# Patient Record
Sex: Male | Born: 1990 | Race: White | Hispanic: No | Smoking: Current every day smoker
Health system: Southern US, Community
[De-identification: ages and names within clinical notes are randomized; demographics above are authoritative.]

## PROBLEM LIST (undated history)

## (undated) DIAGNOSIS — M1990 Unspecified osteoarthritis, unspecified site: Secondary | ICD-10-CM

## (undated) DIAGNOSIS — F419 Anxiety disorder, unspecified: Secondary | ICD-10-CM

## (undated) DIAGNOSIS — T7840XA Allergy, unspecified, initial encounter: Secondary | ICD-10-CM

## (undated) HISTORY — DX: Allergy, unspecified, initial encounter: T78.40XA

## (undated) HISTORY — DX: Anxiety disorder, unspecified: F41.9

## (undated) HISTORY — DX: Unspecified osteoarthritis, unspecified site: M19.90

---
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
## 2010-04-23 ENCOUNTER — Inpatient Hospital Stay (HOSPITAL_COMMUNITY): Admission: AC | Admit: 2010-04-23 | Discharge: 2010-04-27 | Payer: Self-pay | Source: Home / Self Care

## 2010-08-04 LAB — POCT I-STAT, CHEM 8
BUN: 12 mg/dL (ref 6–23)
Calcium, Ion: 1 mmol/L — ABNORMAL LOW (ref 1.12–1.32)
Creatinine, Ser: 1.3 mg/dL (ref 0.4–1.5)
Hemoglobin: 15.6 g/dL (ref 13.0–17.0)
Sodium: 138 meq/L (ref 135–145)
TCO2: 21 mmol/L (ref 0–100)

## 2010-08-04 LAB — COMPREHENSIVE METABOLIC PANEL
ALT: 64 U/L — ABNORMAL HIGH (ref 0–53)
AST: 143 U/L — ABNORMAL HIGH (ref 0–37)
Albumin: 3.7 g/dL (ref 3.5–5.2)
Alkaline Phosphatase: 48 U/L (ref 39–117)
Alkaline Phosphatase: 62 U/L (ref 39–117)
BUN: 10 mg/dL (ref 6–23)
CO2: 22 meq/L (ref 19–32)
CO2: 23 mEq/L (ref 19–32)
Calcium: 9.1 mg/dL (ref 8.4–10.5)
Chloride: 105 mEq/L (ref 96–112)
Creatinine, Ser: 1.11 mg/dL (ref 0.4–1.5)
GFR calc Af Amer: >60 mL/min (ref >60)
GFR calc non Af Amer: >60 mL/min (ref >60)
Glucose, Bld: 126 mg/dL — ABNORMAL HIGH (ref 70–99)
Glucose, Bld: 95 mg/dL (ref 70–99)
Potassium: 3.4 meq/L — ABNORMAL LOW (ref 3.5–5.1)
Potassium: 4.2 mEq/L (ref 3.5–5.1)
Sodium: 138 meq/L (ref 135–145)
Total Bilirubin: 1.8 mg/dL — ABNORMAL HIGH (ref 0.3–1.2)
Total Protein: 7.3 g/dL (ref 6.0–8.3)

## 2010-08-04 LAB — CBC
HCT: 38.6 % — ABNORMAL LOW (ref 39.0–52.0)
HCT: 43.2 % (ref 39.0–52.0)
Hemoglobin: 13.5 g/dL (ref 13.0–17.0)
Hemoglobin: 15.3 g/dL (ref 13.0–17.0)
MCH: 32.1 pg (ref 26.0–34.0)
MCHC: 35.4 g/dL (ref 30.0–36.0)
MCV: 91.3 fL (ref 78.0–100.0)
MCV: 91.9 fL (ref 78.0–100.0)
Platelets: 154 10*3/uL (ref 150–400)
RBC: 4.2 MIL/uL — ABNORMAL LOW (ref 4.22–5.81)
RDW: 12.3 % (ref 11.5–15.5)
WBC: 13.2 10*3/uL — ABNORMAL HIGH (ref 4.0–10.5)
WBC: 16.1 10*3/uL — ABNORMAL HIGH (ref 4.0–10.5)
WBC: 23.6 10*3/uL — ABNORMAL HIGH (ref 4.0–10.5)

## 2010-08-04 LAB — LIPASE, BLOOD: Lipase: 36 U/L (ref 11–59)

## 2010-08-04 LAB — LACTIC ACID, PLASMA: Lactic Acid, Venous: 2.8 mmol/L — ABNORMAL HIGH (ref 0.5–2.2)
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
## 2011-07-16 IMAGING — CR DG CHEST 2V
2 series · 2 of 2 positions shown · non-contrast
Comparison: 04/24/2010

CLINICAL DATA: Left pneumothorax.

CHEST - 2 VIEW

[w chest pa]
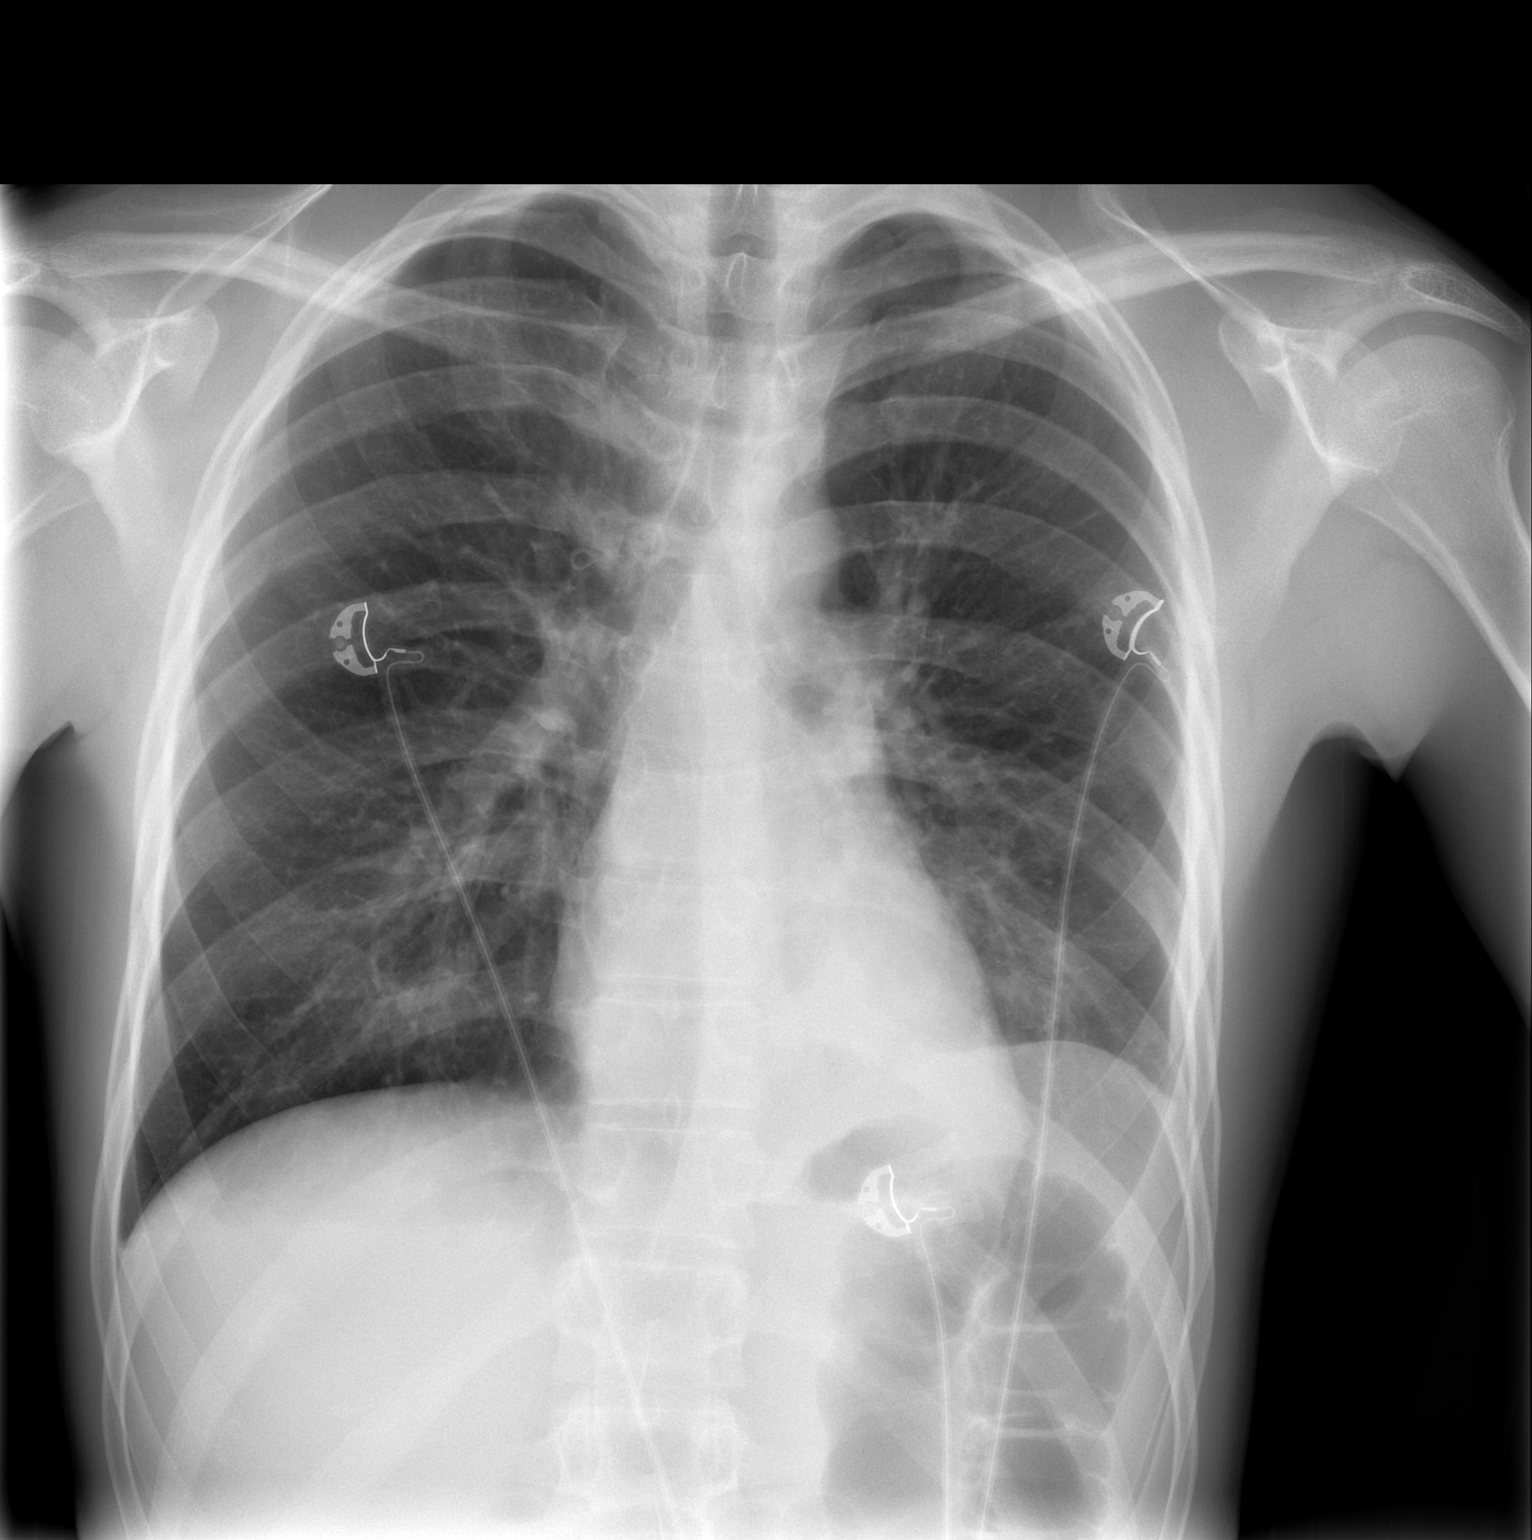

[w chest lat]
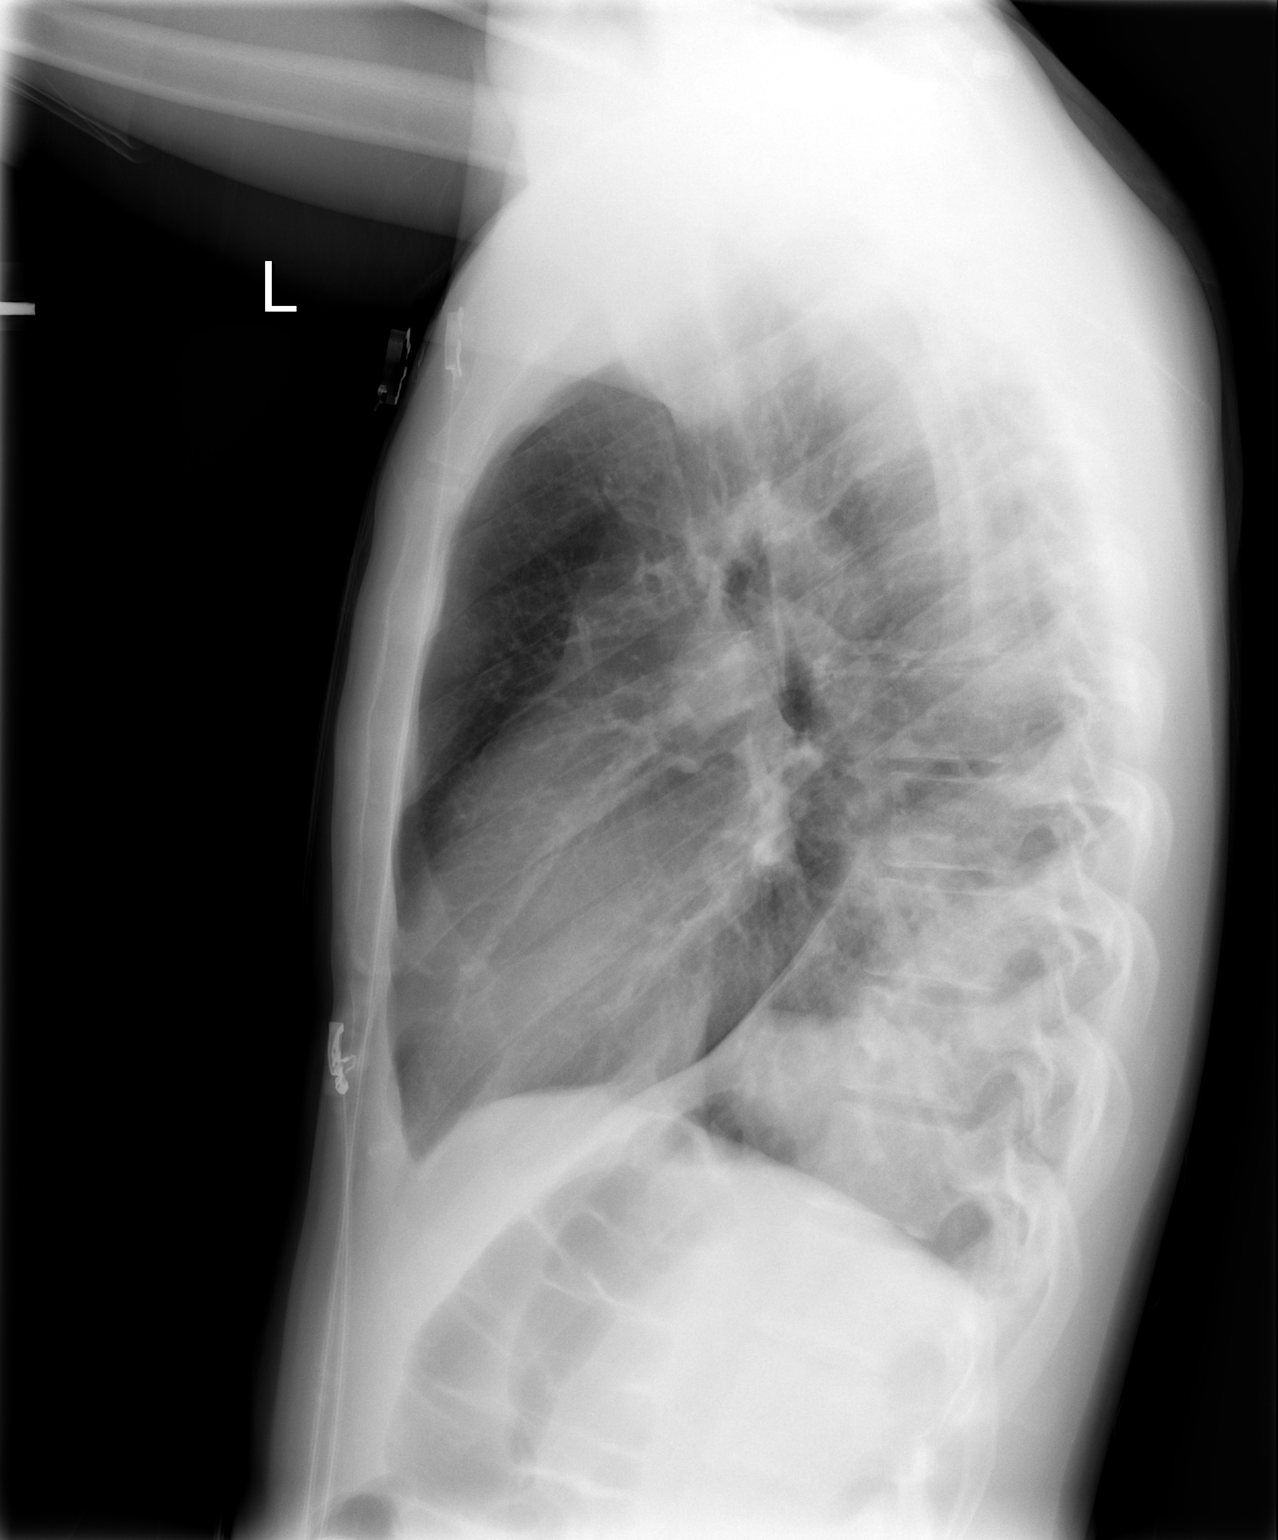

[2 of 2 positions shown; findings below may reference images not displayed]

FINDINGS: The tiny left pneumothorax has almost completely
resolved.  Left lateral rib fractures are again noted.

The patient has developed an area of atelectasis and infiltrate at
the left lung base posterior medially.  This could  represent
pulmonary contusion.

The right lung is clear.
IMPRESSION: 1.  The tiny left apical pneumothorax has almost resolved.
2.  New consolidation and atelectasis in the left lung base
posterior medially which may represent atelectasis and/or lung
contusion.

## 2021-11-17 ENCOUNTER — Ambulatory Visit: Payer: Self-pay | Admitting: Physician Assistant

## 2021-11-17 NOTE — Progress Notes (Deleted)
    New patient visit   Patient: Warren Day   DOB: 1990/07/01   30 y.o. Male  MRN: [id number] Visit Date: 11/17/2021  Today's healthcare provider: Alfredia Ferguson, PA-C   No chief complaint on file.  Subjective    Warren Day is a 31 y.o. male who presents today as a new patient to establish care.  HPI  ***  No past medical history on file. *** The histories are not reviewed yet. Please review them in the "History" navigator section and refresh this SmartLink. No family status information on file.   No family history on file. Social History   Socioeconomic History   Marital status: Single    Spouse name: Not on file   Number of children: Not on file   Years of education: Not on file   Highest education level: Not on file  Occupational History   Not on file  Tobacco Use   Smoking status: Not on file   Smokeless tobacco: Not on file  Substance and Sexual Activity   Alcohol use: Not on file   Drug use: Not on file   Sexual activity: Not on file  Other Topics Concern   Not on file  Social History Narrative   Not on file   Social Determinants of Health   Financial Resource Strain: Not on file  Food Insecurity: Not on file  Transportation Needs: Not on file  Physical Activity: Not on file  Stress: Not on file  Social Connections: Not on file   No outpatient medications prior to visit.   No facility-administered medications prior to visit.   Not on File   There is no immunization history on file for this patient.  Health Maintenance  Topic Date Due   HIV Screening  Never done   Hepatitis C Screening  Never done   TETANUS/TDAP  Never done   INFLUENZA VACCINE  12/22/2021   HPV VACCINES  Aged Out    No care team member to display  Review of Systems  {Labs  Heme  Chem  Endocrine  Serology  Results Review (optional):23779}   Objective    There were no vitals taken for this visit. {Show previous vital signs  (optional):23777}  Physical Exam ***  Depression Screen     No data to display         No results found for any visits on 11/17/21.  Assessment & Plan     ***  No follow-ups on file.     {provider attestation***:1}   Alfredia Ferguson, PA-C  [facility] [phone] (phone) [phone] (fax)  [facility]

## 2022-02-17 ENCOUNTER — Encounter: Payer: Self-pay | Admitting: Physician Assistant

## 2022-02-17 ENCOUNTER — Ambulatory Visit (INDEPENDENT_AMBULATORY_CARE_PROVIDER_SITE_OTHER): Payer: Managed Care, Other (non HMO) | Admitting: Physician Assistant

## 2022-02-17 VITALS — BP 120/82 | HR 63 | Ht 71.0 in | Wt 168.5 lb

## 2022-02-17 DIAGNOSIS — Z1159 Encounter for screening for other viral diseases: Secondary | ICD-10-CM | POA: Diagnosis not present

## 2022-02-17 DIAGNOSIS — R5383 Other fatigue: Secondary | ICD-10-CM | POA: Insufficient documentation

## 2022-02-17 DIAGNOSIS — Z0000 Encounter for general adult medical examination without abnormal findings: Secondary | ICD-10-CM

## 2022-02-17 DIAGNOSIS — Z114 Encounter for screening for human immunodeficiency virus [HIV]: Secondary | ICD-10-CM | POA: Diagnosis not present

## 2022-02-17 NOTE — Progress Notes (Signed)
I,Sha'taria Tyson,acting as a Neurosurgeon for [employer], PA-C.,have documented all relevant documentation on the behalf of Alfredia Ferguson, PA-C,as directed by  Alfredia Ferguson, PA-C while in the presence of Alfredia Ferguson, PA-C.  New patient visit   Patient: Warren Day   DOB: March 08, 1991   30 y.o. Male  MRN: [id number] Visit Date: 02/17/2022  Today's healthcare provider: Alfredia Ferguson, PA-C   Cc. New pt establish care  Subjective    Warren Day is a 31 y.o. male who presents today as a new patient to establish care.  HPI  Patient Is wanting to establish care and would like to just do preventative care today. When asked concerns today-- pt denies, only mentions he sometimes deals with generalized fatigue and is curious about his testosterone levels. Tobacco use , 7.5 year pack history, attempting to quit currently switching to vaping.  Past Medical History:  Diagnosis Date   Allergy    Anxiety    Arthritis    History reviewed. No pertinent surgical history. Family Status  Relation Name Status   Mother  (Not Specified)   Father  (Not Specified)   Sister  (Not Specified)   Family History  Problem Relation Age of Onset   Allergies Mother    Anxiety disorder Mother    Arthritis Mother    Allergies Father    Allergies Sister    Social History   Socioeconomic History   Marital status: Significant Other    Spouse name: Not on file   Number of children: Not on file   Years of education: Not on file   Highest education level: Not on file  Occupational History   Not on file  Tobacco Use   Smoking status: Every Day    Packs/day: 0.50    Years: 15.00    Total pack years: 7.50    Types: Cigarettes   Smokeless tobacco: Never  Vaping Use   Vaping Use: Every day   Start date: 01/31/2022  Substance and Sexual Activity   Alcohol use: Yes    Comment: 2-3 drinks unspecified a week   Drug use: Yes    Types: Marijuana   Sexual activity: Yes  Other Topics  Concern   Not on file  Social History Narrative   Not on file   Social Determinants of Health   Financial Resource Strain: Not on file  Food Insecurity: Not on file  Transportation Needs: Not on file  Physical Activity: Not on file  Stress: Not on file  Social Connections: Not on file   No outpatient medications prior to visit.   No facility-administered medications prior to visit.   Allergies  Allergen Reactions   Poison Ivy Extract Rash     There is no immunization history on file for this patient.  Health Maintenance  Topic Date Due   COVID-19 Vaccine (1) Never done   Hepatitis C Screening  Never done   TETANUS/TDAP  Never done   INFLUENZA VACCINE  08/22/2022 (Originally 12/22/2021)   HIV Screening  Completed   HPV VACCINES  Aged Out    Patient Care Team: Alfredia Ferguson, PA-C as PCP - General (Physician Assistant)  Review of Systems  Constitutional:  Positive for appetite change and fatigue.  HENT:  Positive for congestion, drooling, rhinorrhea, sore throat and tinnitus.   Eyes:  Positive for pain.  Respiratory:  Positive for chest tightness and shortness of breath.   Genitourinary:  Positive for frequency.  Musculoskeletal:  Positive for back  pain.     Objective    Blood pressure 120/82, pulse 63, height 5\' 11"  (1.803 m), weight 168 lb 8 oz (76.4 kg), SpO2 99 %.   Physical Exam Constitutional:      General: He is awake.     Appearance: He is well-developed.  HENT:     Head: Normocephalic.     Right Ear: Tympanic membrane, ear canal and external ear normal.     Left Ear: Tympanic membrane, ear canal and external ear normal.     Nose: Nose normal. No congestion or rhinorrhea.     Mouth/Throat:     Mouth: Mucous membranes are moist.     Pharynx: No oropharyngeal exudate or posterior oropharyngeal erythema.  Eyes:     Pupils: Pupils are equal, round, and reactive to light.  Cardiovascular:     Rate and Rhythm: Normal rate and regular rhythm.      Heart sounds: Normal heart sounds.  Pulmonary:     Effort: Pulmonary effort is normal.     Breath sounds: Normal breath sounds.  Abdominal:     General: There is no distension.     Palpations: Abdomen is soft.     Tenderness: There is no abdominal tenderness. There is no guarding.  Musculoskeletal:     Cervical back: Normal range of motion.     Right lower leg: No edema.     Left lower leg: No edema.  Lymphadenopathy:     Cervical: No cervical adenopathy.  Skin:    General: Skin is warm.  Neurological:     Mental Status: He is alert and oriented to person, place, and time.  Psychiatric:        Attention and Perception: Attention normal.        Mood and Affect: Mood normal.        Speech: Speech normal.        Behavior: Behavior normal. Behavior is cooperative.    Depression Screen    02/17/2022   10:50 AM  PHQ 2/9 Scores  PHQ - 2 Score 2  PHQ- 9 Score 8   No results found for any visits on 02/17/22.  Assessment & Plan      Problem List Items Addressed This Visit       Other   Other fatigue    Likely unrelated to any particular medication condition But will check testosterone levels per pt and tsh/t4      Relevant Orders   Testosterone,Free and Total   TSH + free T4   Other Visit Diagnoses     Annual physical exam    -  Primary   Relevant Orders   CBC w/Diff/Platelet   Comprehensive Metabolic Panel (CMET)   Lipid Profile   HgB A1c   Screening for HIV (human immunodeficiency virus)       Relevant Orders   HIV antibody (with reflex)   Encounter for hepatitis C screening test for low risk patient       Relevant Orders   Hepatitis C antibody      Declines flu vaccines Return in about 1 year (around 02/18/2023) for CPE.     I, Mikey Kirschner, PA-C have reviewed all documentation for this visit. The documentation on  02/17/2022 for the exam, diagnosis, procedures, and orders are all accurate and complete.  Mikey Kirschner, PA-C [facility] [address] [place], [place], [address] Office: [phone] Fax: [place]

## 2022-02-17 NOTE — Patient Instructions (Signed)
Please be sure to have labs done while fasting. Our lab is open Monday - Friday from 8:00 am - 4:30 pm. The lab will close at 11:30 am for lunch and reopen at 1:00 pm. No appointment is needed to receive labs just be sure to have your lab slip available to provide to front desk upon arrival.

## 2022-02-17 NOTE — Assessment & Plan Note (Signed)
Likely unrelated to any particular medication condition But will check testosterone levels per pt and tsh/t4

## 2022-02-18 ENCOUNTER — Other Ambulatory Visit: Payer: Self-pay | Admitting: Physician Assistant

## 2022-02-18 DIAGNOSIS — Z3141 Encounter for fertility testing: Secondary | ICD-10-CM

## 2022-02-19 LAB — CBC WITH DIFFERENTIAL/PLATELET
Eos: 1 %
Immature Granulocytes: 0 %
Lymphs: 12 %
MCHC: 35.3 g/dL (ref 31.5–35.7)
MCV: 89 fL (ref 79–97)
Monocytes Absolute: 1 10*3/uL — ABNORMAL HIGH (ref 0.1–0.9)
Platelets: 245 10*3/uL (ref 150–450)

## 2022-02-19 LAB — COMPREHENSIVE METABOLIC PANEL
Chloride: 101 mmol/L (ref 96–106)
Creatinine, Ser: 1.26 mg/dL (ref 0.76–1.27)
Potassium: 4.5 mmol/L (ref 3.5–5.2)
Sodium: 138 mmol/L (ref 134–144)
Total Protein: 7.3 g/dL (ref 6.0–8.5)

## 2022-02-19 LAB — HEPATITIS C ANTIBODY: Hep C Virus Ab: NONREACTIVE

## 2022-02-19 LAB — LIPID PANEL
Chol/HDL Ratio: 3.3 ratio (ref 0.0–5.0)
Cholesterol, Total: 156 mg/dL (ref 100–199)
LDL Chol Calc (NIH): 92 mg/dL (ref 0–99)

## 2022-02-19 LAB — HEMOGLOBIN A1C: Hgb A1c MFr Bld: 5.3 % (ref 4.8–5.6)

## 2022-02-22 ENCOUNTER — Other Ambulatory Visit: Payer: Self-pay | Admitting: Physician Assistant

## 2022-02-22 DIAGNOSIS — D72829 Elevated white blood cell count, unspecified: Secondary | ICD-10-CM

## 2022-02-25 LAB — COMPREHENSIVE METABOLIC PANEL
ALT: 17 IU/L (ref 0–44)
AST: 23 IU/L (ref 0–40)
Albumin/Globulin Ratio: 2.2 (ref 1.2–2.2)
Albumin: 5 g/dL (ref 4.1–5.1)
Alkaline Phosphatase: 77 IU/L (ref 44–121)
BUN/Creatinine Ratio: 10 (ref 9–20)
BUN: 12 mg/dL (ref 6–20)
Bilirubin Total: 1.2 mg/dL (ref 0.0–1.2)
CO2: 24 mmol/L (ref 20–29)
Calcium: 9.9 mg/dL (ref 8.7–10.2)
Globulin, Total: 2.3 g/dL (ref 1.5–4.5)
Glucose: 93 mg/dL (ref 70–99)
eGFR: 78 mL/min/{1.73_m2} (ref >59)

## 2022-02-25 LAB — CBC WITH DIFFERENTIAL/PLATELET
Basophils Absolute: 0 10*3/uL (ref 0.0–0.2)
Basos: 0 %
EOS (ABSOLUTE): 0.1 10*3/uL (ref 0.0–0.4)
Hematocrit: 42.8 % (ref 37.5–51.0)
Hemoglobin: 15.1 g/dL (ref 13.0–17.7)
Immature Grans (Abs): 0 10*3/uL (ref 0.0–0.1)
Lymphocytes Absolute: 1.4 10*3/uL (ref 0.7–3.1)
MCH: 31.3 pg (ref 26.6–33.0)
Monocytes: 9 %
Neutrophils Absolute: 9.6 10*3/uL — ABNORMAL HIGH (ref 1.4–7.0)
Neutrophils: 78 %
RBC: 4.82 x10E6/uL (ref 4.14–5.80)
RDW: 11.8 % (ref 11.6–15.4)
WBC: 12.2 10*3/uL — ABNORMAL HIGH (ref 3.4–10.8)

## 2022-02-25 LAB — TESTOSTERONE,FREE AND TOTAL
Testosterone, Free: 10.9 pg/mL (ref 8.7–25.1)
Testosterone: 580 ng/dL (ref 264–916)

## 2022-02-25 LAB — HIV ANTIBODY (ROUTINE TESTING W REFLEX): HIV Screen 4th Generation wRfx: NONREACTIVE

## 2022-02-25 LAB — LIPID PANEL
HDL: 48 mg/dL (ref >39)
Triglycerides: 83 mg/dL (ref 0–149)
VLDL Cholesterol Cal: 16 mg/dL (ref 5–40)

## 2022-02-25 LAB — TSH+FREE T4
Free T4: 1.62 ng/dL (ref 0.82–1.77)
TSH: 0.678 u[IU]/mL (ref 0.450–4.500)

## 2022-02-25 LAB — HEMOGLOBIN A1C: Est. average glucose Bld gHb Est-mCnc: 105 mg/dL

## 2022-03-16 LAB — PATHOLOGIST SMEAR REVIEW
Basophils Absolute: 0 10*3/uL (ref 0.0–0.2)
EOS (ABSOLUTE): 0.1 10*3/uL (ref 0.0–0.4)
Hematocrit: 42.2 % (ref 37.5–51.0)
Immature Grans (Abs): 0 10*3/uL (ref 0.0–0.1)
Lymphs: 28 %
MCHC: 34.8 g/dL (ref 31.5–35.7)
Neutrophils Absolute: 4.3 10*3/uL (ref 1.4–7.0)
Neutrophils: 61 %
Platelets: 248 10*3/uL (ref 150–450)
RDW: 12.6 % (ref 11.6–15.4)

## 2022-03-18 LAB — PATHOLOGIST SMEAR REVIEW
Basos: 0 %
Eos: 1 %
Hemoglobin: 14.7 g/dL (ref 13.0–17.7)
Immature Granulocytes: 0 %
Lymphocytes Absolute: 2 10*3/uL (ref 0.7–3.1)
MCH: 31.5 pg (ref 26.6–33.0)
MCV: 90 fL (ref 79–97)
Monocytes Absolute: 0.7 10*3/uL (ref 0.1–0.9)
Monocytes: 10 %
RBC: 4.67 x10E6/uL (ref 4.14–5.80)
WBC: 7.1 10*3/uL (ref 3.4–10.8)

## 2022-07-05 ENCOUNTER — Other Ambulatory Visit: Payer: Self-pay

## 2022-07-05 ENCOUNTER — Telehealth: Payer: Managed Care, Other (non HMO) | Admitting: Physician Assistant

## 2022-07-05 DIAGNOSIS — A084 Viral intestinal infection, unspecified: Secondary | ICD-10-CM

## 2022-07-05 MED ORDER — ONDANSETRON 4 MG PO TBDP
4.0000 mg | ORAL_TABLET | Freq: Three times a day (TID) | ORAL | 0 refills | Status: DC | PRN
  Filled 2022-07-05: qty 20, 7d supply, fill #0

## 2022-07-05 NOTE — Progress Notes (Signed)

## 2022-08-23 ENCOUNTER — Telehealth: Payer: Managed Care, Other (non HMO) | Admitting: Family Medicine

## 2022-08-23 ENCOUNTER — Encounter: Payer: Self-pay | Admitting: Physician Assistant

## 2022-08-23 ENCOUNTER — Other Ambulatory Visit: Payer: Self-pay

## 2022-08-23 DIAGNOSIS — L237 Allergic contact dermatitis due to plants, except food: Secondary | ICD-10-CM

## 2022-08-23 MED ORDER — TRIAMCINOLONE ACETONIDE 0.025 % EX OINT
1.0000 | TOPICAL_OINTMENT | Freq: Two times a day (BID) | CUTANEOUS | 0 refills | Status: DC
Start: 2022-08-23 — End: 2023-03-16
  Filled 2022-08-23: qty 30, 15d supply, fill #0

## 2022-08-23 NOTE — Progress Notes (Signed)
E-Visit for [employer]  We are sorry that you are not feeing well.  Here is how we plan to help!  Based on what you have shared with me it looks like you have had an allergic reaction to the oily resin from a group of plants.  This resin is very sticky, so it easily attaches to your skin, clothing, tools equipment, and pet's fur.    This blistering rash is often called poison ivy rash although it can come from contact with the leaves, stems and roots of poison ivy, poison oak and poison sumac.  The oily resin contains urushiol (u-ROO-she-ol) that produces a skin rash on exposed skin.  The severity of the rash depends on the amount of urushiol that gets on your skin.  A section of skin with more urushiol on it may develop a rash sooner.  The rash usually develops 12-48 hours after exposure and can last two to three weeks.  Your skin must come in direct contact with the plant's oil to be affected.  Blister fluid doesn't spread the rash.  However, if you come into contact with a piece of clothing or pet fur that has urushiol on it, the rash may spread out.  You can also transfer the oil to other parts of your body with your fingers.  Often the rash looks like a straight line because of the way the plant brushes against your skin.  Most poison ivy treatments are usually limited to self-care methods. Small areas of the rash will typically go away on its own in two to three weeks.  Since your rash is limited, I am recommending that you follow these recommendations:  Make sure that the clothes you were wearing and any towels or sheets that may have come in contact with the oil (urushiol) are washed in detergent and hot water.  Apply Benadryl or Caladryl lotion to the rash.  You may apply these as often as needed to control the itching.  Cool baths also often help with itching.  You may also apply an over-the-counter corticosteroid cream for the first few days.  Take oral antihistamines, such as  diphenhydramine (Benadryl, others), which may also help you sleep better.  Soak in a cool-water bath containing an oatmeal-based bath product (Aveeno).  Place Cool, wet compresses on the affected area for 15 to 30 minutes several times a day.  Avoid a hot shower or bath as this may increase your itching.     I have developed the following plan to treat your condition  It appears very mild, we usually limit the use of prednisone unless it has spread to several areas of the body.   I will order a topical steroid ointment to prevent this from worsening.  What can you do to prevent this rash?  Avoid the plants.  Learn how to identify poison ivy, poison oak and poison sumac in all seasons.  When hiking or engaging in other activities that might expose you to these plants, try to stay on cleared pathways.  If camping, make sure you pitch your tent in an area free of these plants.  Keep pets from running through wooded areas so that urushiol doesn't accidentally stick to their fur, which you may touch.  Remove or kill the plants.  In your yard, you can get rid of poison ivy by applying an herbicide or pulling it out of the ground, including the roots, while wearing heavy gloves.  Afterward remove the gloves and  thoroughly wash them and your hands.  Don't burn poison ivy or related plants because the urushiol can be carried by smoke.  Wear protective clothing.  If needed, protect your skin by wearing socks, boots, pants, long sleeves and vinyl gloves.  Wash your skin right away.  Washing off the oil with soap and water within 30 minutes of exposure may reduce your chances of getting a poison ivy rash.  Even washing after an hour or so can help reduce the severity of the rash.  If you walk through some poison ivy and then later touch your shoes, you may get some urushiol on your hands, which may then transfer to your face or body by touching or rubbing.  If the contaminated object isn't cleaned, the urushiol  on it can still cause a skin reaction years later.    Be careful not to reuse towels after you have washed your skin.  Also carefully wash clothing in detergent and hot water to remove all traces of the oil.  Handle contaminated clothing carefully so you don't transfer the urushiol to yourself, furniture, rugs or appliances.  Remember that pets can carry the oil on their fur and paws.  If you think your pet may be contaminated with urushiol, put on some long rubber gloves and give your pet a bath.  Finally, be careful not to burn these plants as the smoke can contain traces of the oil.  Inhaling the smoke may result in difficulty breathing. If that occurred you should see a physician as soon as possible.  See your doctor right away if:  The reaction is severe or widespread You inhaled the smoke from burning poison ivy and are having difficulty breathing Your skin continues to swell The rash affects your eyes, mouth or genitals Blisters are oozing pus You develop a fever greater than 100 F (37.8 C) The rash doesn't get better within a few weeks.  If you scratch the poison ivy rash, bacteria under your fingernails may cause the skin to become infected.  See your doctor if pus starts oozing from the blisters.  Treatment generally includes antibiotics.  Poison ivy treatments are usually limited to self-care methods.  And the rash typically goes away on its own in two to three weeks.     If the rash is widespread or results in a large number of blisters, your doctor may prescribe an oral corticosteroid, such as prednisone.  If a bacterial infection has developed at the rash site, your doctor may give you a prescription for an oral antibiotic.  MAKE SURE YOU  Understand these instructions. Will watch your condition. Will get help right away if you are not doing well or get worse.   Thank you for choosing an e-visit.  Your e-visit answers were reviewed by a board certified advanced  clinical practitioner to complete your personal care plan. Depending upon the condition, your plan could have included both over the counter or prescription medications.  Please review your pharmacy choice. Make sure the pharmacy is open so you can pick up prescription now. If there is a problem, you may contact your provider through [employer] and have the prescription routed to another pharmacy.  Your safety is important to [place]. If you have drug allergies check your prescription carefully.   For the next 24 hours you can use MyChart to ask questions about today's visit, request a non-urgent call back, or ask for a work or school excuse. You will get an email  in the next two days asking about your experience. I hope that your e-visit has been valuable and will speed your recovery.   I provided 5 minutes of non face-to-face time during this encounter for chart review, medication and order placement, as well as and documentation.

## 2022-08-24 ENCOUNTER — Encounter: Payer: Self-pay | Admitting: Physician Assistant

## 2022-08-30 ENCOUNTER — Other Ambulatory Visit: Payer: Self-pay

## 2022-08-30 ENCOUNTER — Ambulatory Visit: Payer: Managed Care, Other (non HMO) | Admitting: Physician Assistant

## 2022-08-30 VITALS — BP 111/74 | HR 66 | Ht 71.0 in | Wt 168.0 lb

## 2022-08-30 DIAGNOSIS — S30861A Insect bite (nonvenomous) of abdominal wall, initial encounter: Secondary | ICD-10-CM

## 2022-08-30 DIAGNOSIS — W57XXXA Bitten or stung by nonvenomous insect and other nonvenomous arthropods, initial encounter: Secondary | ICD-10-CM

## 2022-08-30 MED ORDER — MUPIROCIN 2 % EX OINT
1.0000 | TOPICAL_OINTMENT | Freq: Two times a day (BID) | CUTANEOUS | 0 refills | Status: DC
Start: 2022-08-30 — End: 2023-03-16
  Filled 2022-08-30: qty 22, 15d supply, fill #0

## 2022-08-30 MED ORDER — DOXYCYCLINE HYCLATE 100 MG PO TABS
100.0000 mg | ORAL_TABLET | Freq: Two times a day (BID) | ORAL | 0 refills | Status: AC
Start: 2022-08-30 — End: 2022-09-09
  Filled 2022-08-30: qty 20, 10d supply, fill #0

## 2022-08-30 NOTE — Progress Notes (Signed)
     I,Sha'taria Tyson,acting as a Neurosurgeon for [employer], PA-C.,have documented all relevant documentation on the behalf of Alfredia Ferguson, PA-C,as directed by  Alfredia Ferguson, PA-C while in the presence of Alfredia Ferguson, PA-C.   Established patient visit   Patient: Warren Day   DOB: 09/07/1990   32 y.o. Male  MRN: [id number] Visit Date: 08/30/2022  Today's healthcare provider: Alfredia Ferguson, PA-C   Cc. Tick bite  Subjective    HPI  Pt reports being bit by a tick 08/21/2022.  He was outside over the weekend and noticed it the following Monday. He was able to remove it but noticed some oozing at the site.   Patient reports he has only wiped it with alcohol to clean it. Patient reports it does look like its starting to scab   Medications: Outpatient Medications Prior to Visit  Medication Sig   ondansetron (ZOFRAN-ODT) 4 MG disintegrating tablet Dissolve 1 tablet (4 mg total) in the mouth every 8 (eight) hours as needed. (Patient not taking: Reported on 08/30/2022)   triamcinolone (KENALOG) 0.025 % ointment Apply 1 Application topically in the morning and at bedtime. (Patient not taking: Reported on 08/30/2022)   No facility-administered medications prior to visit.    Review of Systems  Constitutional:  Negative for fatigue and fever.  Respiratory:  Negative for cough and shortness of breath.   Cardiovascular:  Negative for chest pain, palpitations and leg swelling.  Neurological:  Negative for dizziness and headaches.      Objective    BP 111/74 (BP Location: Left Arm, Patient Position: Sitting, Cuff Size: Normal)   Pulse 66   Ht 5\' 11"  (1.803 m)   Wt 168 lb (76.2 kg)   SpO2 99%   BMI 23.43 kg/m    Physical Exam Vitals reviewed.  Constitutional:      Appearance: He is not ill-appearing.  HENT:     Head: Normocephalic.  Eyes:     Conjunctiva/sclera: Conjunctivae normal.  Cardiovascular:     Rate and Rhythm: Normal rate.  Pulmonary:     Effort: Pulmonary  effort is normal. No respiratory distress.  Skin:    Comments: Left side of umbilicus with a erythematous papule. Not fluctuant  Neurological:     General: No focal deficit present.     Mental Status: He is alert and oriented to person, place, and time.  Psychiatric:        Mood and Affect: Mood normal.        Behavior: Behavior normal.      No results found for any visits on 08/30/22.  Assessment & Plan     Tick bite, abdomen Does not appear infected No erythematous migrans Given timeline will treat Prophylactically for tick borne disease Doxy 100 bid x 10 days Given topical muiporcin   Return if symptoms worsen or fail to improve.      I, Alfredia Ferguson, PA-C have reviewed all documentation for this visit. The documentation on  08/30/22 for the exam, diagnosis, procedures, and orders are all accurate and complete.  Alfredia Ferguson, PA-C [facility] [address] [place], [place], [address] Office: [phone] Fax: [phone]   [facility]

## 2022-12-01 ENCOUNTER — Encounter (INDEPENDENT_AMBULATORY_CARE_PROVIDER_SITE_OTHER): Payer: Managed Care, Other (non HMO) | Admitting: Physician Assistant

## 2022-12-01 DIAGNOSIS — K1379 Other lesions of oral mucosa: Secondary | ICD-10-CM

## 2022-12-01 NOTE — Telephone Encounter (Signed)

## 2023-02-22 ENCOUNTER — Encounter: Payer: Self-pay | Admitting: Physician Assistant

## 2023-02-28 ENCOUNTER — Encounter: Payer: Managed Care, Other (non HMO) | Admitting: Family Medicine

## 2023-03-16 ENCOUNTER — Encounter: Payer: Managed Care, Other (non HMO) | Admitting: Family Medicine

## 2023-03-16 DIAGNOSIS — Z0000 Encounter for general adult medical examination without abnormal findings: Secondary | ICD-10-CM | POA: Insufficient documentation

## 2023-03-16 NOTE — Progress Notes (Deleted)
Complete physical exam   Patient: Warren Day   DOB: [birth year]   32 y.o. Male  MRN: [id number] Visit Date: 03/16/2023  Today's healthcare provider: Sherlyn Hay, DO   No chief complaint on file.  Subjective    Warren Day is a 32 y.o. male who presents today for a complete physical exam.  He reports consuming a {diet types:17450} diet. {Exercise:19826} He generally feels {well/fairly well/poorly:18703}. He reports sleeping {well/fairly well/poorly:18703}. He {does/does not:200015} have additional problems to discuss today.  HPI      ***  Past Medical History:  Diagnosis Date   Allergy    Anxiety    Arthritis    No past surgical history on file. Social History   Socioeconomic History   Marital status: Significant Other    Spouse name: Not on file   Number of children: Not on file   Years of education: Not on file   Highest education level: 12th grade  Occupational History   Not on file  Tobacco Use   Smoking status: Every Day    Current packs/day: 0.50    Average packs/day: 0.5 packs/day for 15.0 years (7.5 ttl pk-yrs)    Types: Cigarettes   Smokeless tobacco: Never  Vaping Use   Vaping status: Every Day   Start date: 01/31/2022  Substance and Sexual Activity   Alcohol use: Yes    Comment: 2-3 drinks unspecified a week   Drug use: Yes    Types: Marijuana   Sexual activity: Yes  Other Topics Concern   Not on file  Social History Narrative   [employer]   Flies and repairs planes   Social Determinants of Health   Financial Resource Strain: Medium Risk (03/14/2023)   Overall Financial Resource Strain (CARDIA)    Difficulty of Paying Living Expenses: Somewhat hard  Food Insecurity: Food Insecurity Present (03/14/2023)   Hunger Vital Sign    Worried About Running Out of Food in the Last Year: Sometimes true    Ran Out of Food in the Last Year: Sometimes true  Transportation Needs: No Transportation Needs (03/14/2023)   PRAPARE -  Administrator, Civil Service (Medical): No    Lack of Transportation (Non-Medical): No  Physical Activity: Inactive (03/14/2023)   Exercise Vital Sign    Days of Exercise per Week: 0 days    Minutes of Exercise per Session: 30 min  Stress: Stress Concern Present (03/14/2023)   [employer] of Occupational Health - Occupational Stress Questionnaire    Feeling of Stress : Very much  Social Connections: Moderately Isolated (03/14/2023)   Social Connection and Isolation Panel [NHANES]    Frequency of Communication with Friends and Family: Once a week    Frequency of Social Gatherings with Friends and Family: Once a week    Attends Religious Services: More than 4 times per year    Active Member of [employer] or Organizations: Yes    Attends Engineer, structural: More than 4 times per year    Marital Status: Never married  Catering manager Violence: Not on file   Family Status  Relation Name Status   Mother  (Not Specified)   Father  (Not Specified)   Sister  (Not Specified)  No partnership data on file   Family History  Problem Relation Age of Onset   Allergies Mother    Anxiety disorder Mother    Arthritis Mother    Allergies Father    Allergies Sister  Allergies  Allergen Reactions   Poison Ivy Extract Rash   Penicillins Rash    At 7 or 8    Patient Care Team: Burnett Corrente as PCP - General (Physician Assistant)   Medications: Outpatient Medications Prior to Visit  Medication Sig   [DISCONTINUED] mupirocin ointment (BACTROBAN) 2 % Apply 1 Application topically 2 (two) times daily.   [DISCONTINUED] ondansetron (ZOFRAN-ODT) 4 MG disintegrating tablet Dissolve 1 tablet (4 mg total) in the mouth every 8 (eight) hours as needed. (Patient not taking: Reported on 08/30/2022)   [DISCONTINUED] triamcinolone (KENALOG) 0.025 % ointment Apply 1 Application topically in the morning and at bedtime. (Patient not taking: Reported on 08/30/2022)   No  facility-administered medications prior to visit.    Review of Systems {Insert previous labs (optional):23779} {See past labs  Heme  Chem  Endocrine  Serology  Results Review (optional):1}  Objective    There were no vitals taken for this visit. {Insert last BP/Wt (optional):23777}{See vitals history (optional):1}  Physical Exam  ***  Last depression screening scores    08/30/2022    8:28 AM 02/17/2022   10:50 AM  PHQ 2/9 Scores  PHQ - 2 Score 2 2  PHQ- 9 Score 6 8   Last fall risk screening    08/30/2022    8:28 AM  Fall Risk   Falls in the past year? 0  Number falls in past yr: 0  Injury with Fall? 0  Risk for fall due to : No Fall Risks  Follow up Falls evaluation completed   Last Audit-C alcohol use screening    03/14/2023    5:12 PM  Alcohol Use Disorder Test (AUDIT)  1. How often do you have a drink containing alcohol? 2  2. How many drinks containing alcohol do you have on a typical day when you are drinking? 1  3. How often do you have six or more drinks on one occasion? 1  AUDIT-C Score 4  4. How often during the last year have you found that you were not able to stop drinking once you had started? 0  5. How often during the last year have you failed to do what was normally expected from you because of drinking? 0  6. How often during the last year have you needed a first drink in the morning to get yourself going after a heavy drinking session? 0  7. How often during the last year have you had a feeling of guilt of remorse after drinking? 0  8. How often during the last year have you been unable to remember what happened the night before because you had been drinking? 0  9. Have you or someone else been injured as a result of your drinking? 0  10. Has a relative or friend or a doctor or another health worker been concerned about your drinking or suggested you cut down? 0  Alcohol Use Disorder Identification Test Final Score (AUDIT) 4   A score of 3 or  more in women, and 4 or more in men indicates increased risk for alcohol abuse, EXCEPT if all of the points are from question 1   No results found for any visits on 03/16/23.  Assessment & Plan    Routine Health Maintenance and Physical Exam  Exercise Activities and Dietary recommendations  Goals   None      There is no immunization history on file for this patient.  Health Maintenance  Topic Date Due   DTaP/Tdap/Td (  1 - Tdap) Never done   INFLUENZA VACCINE  Never done   COVID-19 Vaccine (1 - 2023-24 season) Never done   Hepatitis C Screening  Completed   HIV Screening  Completed   HPV VACCINES  Aged Out    Discussed health benefits of physical activity, and encouraged him to engage in regular exercise appropriate for his age and condition.   Annual physical exam     ***  No follow-ups on file.     I discussed the assessment and treatment plan with the patient  The patient was provided an opportunity to ask questions and all were answered. The patient agreed with the plan and demonstrated an understanding of the instructions.   The patient was advised to call back or seek an in-person evaluation if the symptoms worsen or if the condition fails to improve as anticipated.    Sherlyn Hay, DO  [facility] [phone] (phone) [phone] (fax)  [facility]

## 2023-03-18 ENCOUNTER — Other Ambulatory Visit: Payer: Self-pay

## 2023-03-18 ENCOUNTER — Encounter: Payer: Self-pay | Admitting: Family Medicine

## 2023-03-18 ENCOUNTER — Ambulatory Visit (INDEPENDENT_AMBULATORY_CARE_PROVIDER_SITE_OTHER): Payer: Managed Care, Other (non HMO) | Admitting: Family Medicine

## 2023-03-18 VITALS — BP 120/76 | HR 85 | Resp 16 | Ht 71.0 in | Wt 160.8 lb

## 2023-03-18 DIAGNOSIS — F411 Generalized anxiety disorder: Secondary | ICD-10-CM | POA: Insufficient documentation

## 2023-03-18 DIAGNOSIS — Z0001 Encounter for general adult medical examination with abnormal findings: Secondary | ICD-10-CM

## 2023-03-18 DIAGNOSIS — J302 Other seasonal allergic rhinitis: Secondary | ICD-10-CM | POA: Diagnosis not present

## 2023-03-18 DIAGNOSIS — F4321 Adjustment disorder with depressed mood: Secondary | ICD-10-CM | POA: Diagnosis not present

## 2023-03-18 DIAGNOSIS — Z0000 Encounter for general adult medical examination without abnormal findings: Secondary | ICD-10-CM

## 2023-03-18 DIAGNOSIS — Z716 Tobacco abuse counseling: Secondary | ICD-10-CM | POA: Insufficient documentation

## 2023-03-18 DIAGNOSIS — Z141 Cystic fibrosis carrier: Secondary | ICD-10-CM

## 2023-03-18 MED ORDER — BUPROPION HCL ER (SR) 150 MG PO TB12
150.0000 mg | ORAL_TABLET | Freq: Two times a day (BID) | ORAL | 0 refills | Status: AC
Start: 2023-03-18 — End: ?
  Filled 2023-03-18: qty 59, 30d supply, fill #0

## 2023-03-18 NOTE — Patient Instructions (Signed)
Claritin for allergies, can switch to zyrtec or allegra if not helping after a week.

## 2023-03-18 NOTE — Assessment & Plan Note (Signed)
Start wellbutrin

## 2023-03-18 NOTE — Progress Notes (Unsigned)
Complete physical exam   Patient: Warren Day   DOB: December 28, 1990   31 y.o. Male  MRN: [id number] Visit Date: 03/18/2023  Today's healthcare provider: Sherlyn Hay, DO   Chief Complaint  Patient presents with   Annual Exam    No new problems. Patient states that they had a failed pregnancy and the baby had cystic fibrosis has questions if he is a carrier    Subjective    Warren Day is a 32 y.o. male who presents today for a complete physical exam.  He reports consuming a general ("terrible") diet. The patient does not participate in regular exercise at present due to recent pregnancy loss. He does plan to resume regular exercises 2-3 x per week. He generally feels fairly well. He reports sleeping fairly well. He does have additional problems to discuss today.  HPI HPI     Annual Exam    Additional comments: No new problems. Patient states that they had a failed pregnancy and the baby had cystic fibrosis has questions if he is a carrier       Last edited by Jama Flavors, CMA on 03/18/2023  3:27 PM.      Frequently has a sore back but reports that he does slouch frequently.  Stopped recommended vitamins and did men's prenatal until they knew about the recent pregnancy. - Recent pregnancy loss with partner [facility] 03/11/23) after struggling to conceive.   Hit left thumb nail with hammer previously, now has numbness chronically.   Past Medical History:  Diagnosis Date   Allergy    Anxiety    Arthritis    History reviewed. No pertinent surgical history. Social History   Socioeconomic History   Marital status: Significant Other    Spouse name: Not on file   Number of children: Not on file   Years of education: Not on file   Highest education level: 12th grade  Occupational History   Not on file  Tobacco Use   Smoking status: Every Day    Current packs/day: 0.50    Average packs/day: 0.5 packs/day for 15.0 years (7.5 ttl pk-yrs)    Types:  Cigarettes   Smokeless tobacco: Never  Vaping Use   Vaping status: Every Day   Start date: 01/31/2022  Substance and Sexual Activity   Alcohol use: Yes    Comment: 2-3 drinks unspecified a week   Drug use: Yes    Types: Marijuana   Sexual activity: Yes  Other Topics Concern   Not on file  Social History Narrative   [employer]   Flies and repairs planes   Social Determinants of Health   Financial Resource Strain: Medium Risk (03/14/2023)   Overall Financial Resource Strain (CARDIA)    Difficulty of Paying Living Expenses: Somewhat hard  Food Insecurity: Food Insecurity Present (03/14/2023)   Hunger Vital Sign    Worried About Running Out of Food in the Last Year: Sometimes true    Ran Out of Food in the Last Year: Sometimes true  Transportation Needs: No Transportation Needs (03/14/2023)   PRAPARE - Administrator, Civil Service (Medical): No    Lack of Transportation (Non-Medical): No  Physical Activity: Inactive (03/14/2023)   Exercise Vital Sign    Days of Exercise per Week: 0 days    Minutes of Exercise per Session: 30 min  Stress: Stress Concern Present (03/14/2023)   [employer] of Occupational Health - Occupational Stress Questionnaire  Feeling of Stress : Very much  Social Connections: Moderately Isolated (03/14/2023)   Social Connection and Isolation Panel [NHANES]    Frequency of Communication with Friends and Family: Once a week    Frequency of Social Gatherings with Friends and Family: Once a week    Attends Religious Services: More than 4 times per year    Active Member of [employer] or Organizations: Yes    Attends Engineer, structural: More than 4 times per year    Marital Status: Never married  Catering manager Violence: Not on file   Family Status  Relation Name Status   Mother  (Not Specified)   Father  (Not Specified)   Sister  (Not Specified)  No partnership data on file   Family History  Problem Relation Age of Onset    Allergies Mother    Anxiety disorder Mother    Arthritis Mother    Allergies Father    Allergies Sister    Allergies  Allergen Reactions   Poison Ivy Extract Rash   Penicillins Rash    At 8 or 8    Patient Care Team: Sherlyn Hay, DO as PCP - General (Family Medicine)   Medications: No outpatient medications prior to visit.   No facility-administered medications prior to visit.    Review of Systems  HENT:  Positive for congestion (at night) and postnasal drip.   Respiratory:  Positive for shortness of breath (intermittent). Negative for cough and wheezing.   Cardiovascular:  Negative for chest pain, palpitations and leg swelling.  Musculoskeletal:  Positive for back pain (sore, intermittently).  Neurological:  Negative for weakness and headaches.      Objective    BP 120/76   Pulse 85   Resp 16   Ht 5\' 11"  (1.803 m)   Wt 160 lb 12.8 oz (72.9 kg)   SpO2 99%   BMI 22.43 kg/m    Physical Exam Vitals and nursing note reviewed.  Constitutional:      General: He is awake.     Appearance: Normal appearance.  HENT:     Head: Normocephalic and atraumatic.     Right Ear: Tympanic membrane, ear canal and external ear normal.     Left Ear: Tympanic membrane, ear canal and external ear normal.     Nose: Nose normal.     Mouth/Throat:     Mouth: Mucous membranes are moist.     Pharynx: Oropharynx is clear. No oropharyngeal exudate or posterior oropharyngeal erythema.  Eyes:     General: No scleral icterus.    Extraocular Movements: Extraocular movements intact.     Conjunctiva/sclera: Conjunctivae normal.     Pupils: Pupils are equal, round, and reactive to light.  Neck:     Thyroid: No thyromegaly or thyroid tenderness.  Cardiovascular:     Rate and Rhythm: Normal rate and regular rhythm.     Pulses: Normal pulses.     Heart sounds: Normal heart sounds.  Pulmonary:     Effort: Pulmonary effort is normal. No tachypnea, bradypnea or respiratory distress.      Breath sounds: Normal breath sounds. No stridor. No wheezing, rhonchi or rales.  Abdominal:     General: Bowel sounds are normal. There is no distension.     Palpations: Abdomen is soft. There is no mass.     Tenderness: There is no abdominal tenderness. There is no guarding.     Hernia: No hernia is present.  Musculoskeletal:     Cervical  back: Normal range of motion and neck supple.     Right lower leg: No edema.     Left lower leg: No edema.  Lymphadenopathy:     Cervical: No cervical adenopathy.  Skin:    General: Skin is warm and dry.  Neurological:     Mental Status: He is alert and oriented to person, place, and time. Mental status is at baseline.  Psychiatric:        Mood and Affect: Mood normal.        Behavior: Behavior normal.      Last depression screening scores    03/18/2023    3:28 PM 08/30/2022    8:28 AM 02/17/2022   10:50 AM  PHQ 2/9 Scores  PHQ - 2 Score 3 2 2   PHQ- 9 Score 11 6 8    Last fall risk screening    08/30/2022    8:28 AM  Fall Risk   Falls in the past year? 0  Number falls in past yr: 0  Injury with Fall? 0  Risk for fall due to : No Fall Risks  Follow up Falls evaluation completed   Last Audit-C alcohol use screening    03/14/2023    5:12 PM  Alcohol Use Disorder Test (AUDIT)  1. How often do you have a drink containing alcohol? 2  2. How many drinks containing alcohol do you have on a typical day when you are drinking? 1  3. How often do you have six or more drinks on one occasion? 1  AUDIT-C Score 4  4. How often during the last year have you found that you were not able to stop drinking once you had started? 0  5. How often during the last year have you failed to do what was normally expected from you because of drinking? 0  6. How often during the last year have you needed a first drink in the morning to get yourself going after a heavy drinking session? 0  7. How often during the last year have you had a feeling of guilt of remorse  after drinking? 0  8. How often during the last year have you been unable to remember what happened the night before because you had been drinking? 0  9. Have you or someone else been injured as a result of your drinking? 0  10. Has a relative or friend or a doctor or another health worker been concerned about your drinking or suggested you cut down? 0  Alcohol Use Disorder Identification Test Final Score (AUDIT) 4   A score of 3 or more in women, and 4 or more in men indicates increased risk for alcohol abuse, EXCEPT if all of the points are from question 1   No results found for any visits on 03/18/23.  Assessment & Plan    Routine Health Maintenance and Physical Exam  Exercise Activities and Dietary recommendations  Goals      Resume/maintain daily vitamins     Patient will restart taking his multivitamins, in order to be at an optimal level when he and his partner are ready to attempt to conceive again.         There is no immunization history on file for this patient.  Health Maintenance  Topic Date Due   DTaP/Tdap/Td (1 - Tdap) Never done   COVID-19 Vaccine (1 - 2023-24 season) 04/03/2023 (Originally 01/23/2023)   INFLUENZA VACCINE  08/22/2023 (Originally 12/23/2022)   Hepatitis C Screening  Completed  HIV Screening  Completed   HPV VACCINES  Aged Out    Discussed health benefits of physical activity, and encouraged him to engage in regular exercise appropriate for his age and condition.   Annual physical exam Assessment & Plan: Physical exam overall unremarkable except as noted above. Routine lab work ordered as noted.  Orders: -     Comprehensive metabolic panel -     Lipid panel  Grief reaction Assessment & Plan: Discussed with patient that his reaction is wholly normal and offered him referral to psychology for counseling, to which she was amenable.  Orders: -     Ambulatory referral to Psychology  Generalized anxiety disorder Assessment & Plan: Patient  does endorse underlying, chronic anxiety and expressed a desire to go ahead and start medication to address this today.   Given the patient's desire to stop smoking as well, will start wellbutrin as noted.  Orders: -     buPROPion HCl ER (SR); Take 1 tablet (150 mg total) by mouth 2 (two) times daily.  Dispense: 59 tablet; Refill: 0  Encounter for smoking cessation counseling Assessment & Plan: Patient is interested in stopping smoking, though he was going to delay it initially. I counseled him that sooner is better, particularly given his and his partner's desire to have a child and that it will reduce the baby's exposure to the harmful chemicals produced by smoking, as well as increase the likelihood of conception.   Cystic fibrosis carrier Assessment & Plan: Discussed with patient that, if the baby was identified as having cystic fibrosis by DNA, then he is definitely a carrier for the gene, as cystic fibrosis is homozygous recessive.  Offered patient referral to genetics for additional screening if desired, which patient declined at this time. Did clarify for him that, with the genetics of cystic fibrosis, there is a 1 in 4 chance of any baby they conceive having the disease, not 1 in 4 chance of a conceived fetuses surviving to delivery,   Seasonal allergies Assessment & Plan: Recommended patient take Claritin daily for allergies, with the option to switch to zyrtec or allegra if not helping after a week.    Return in about 4 weeks (around 04/15/2023) for Anx/Dep.     I discussed the assessment and treatment plan with the patient  The patient was provided an opportunity to ask questions and all were answered. The patient agreed with the plan and demonstrated an understanding of the instructions.   The patient was advised to call back or seek an in-person evaluation if the symptoms worsen or if the condition fails to improve as anticipated.    Sherlyn Hay, DO  [facility]  [facility] [phone] (phone) [phone] (fax)  [facility]

## 2023-03-24 DIAGNOSIS — Z141 Cystic fibrosis carrier: Secondary | ICD-10-CM | POA: Insufficient documentation

## 2023-03-24 DIAGNOSIS — J302 Other seasonal allergic rhinitis: Secondary | ICD-10-CM | POA: Insufficient documentation

## 2023-03-24 NOTE — Assessment & Plan Note (Signed)
Discussed with patient that his reaction is wholly normal and offered him referral to psychology for counseling, to which she was amenable.

## 2023-03-24 NOTE — Assessment & Plan Note (Signed)
Physical exam overall unremarkable except as noted above. Routine lab work ordered as noted.

## 2023-03-24 NOTE — Assessment & Plan Note (Signed)
Recommended patient take Claritin daily for allergies, with the option to switch to zyrtec or allegra if not helping after a week.

## 2023-03-24 NOTE — Assessment & Plan Note (Signed)
Discussed with patient that, if the baby was identified as having cystic fibrosis by DNA, then he is definitely a carrier for the gene, as cystic fibrosis is homozygous recessive.  Offered patient referral to genetics for additional screening if desired, which patient declined at this time. Did clarify for him that, with the genetics of cystic fibrosis, there is a 1 in 4 chance of any baby they conceive having the disease, not 1 in 4 chance of a conceived fetuses surviving to delivery,

## 2023-03-29 LAB — COMPREHENSIVE METABOLIC PANEL
ALT: 18 [IU]/L (ref 0–44)
AST: 21 [IU]/L (ref 0–40)
Albumin: 4.7 g/dL (ref 4.1–5.1)
Alkaline Phosphatase: 75 [IU]/L (ref 44–121)
BUN/Creatinine Ratio: 13 (ref 9–20)
BUN: 13 mg/dL (ref 6–20)
Bilirubin Total: 0.6 mg/dL (ref 0.0–1.2)
CO2: 25 mmol/L (ref 20–29)
Calcium: 10 mg/dL (ref 8.7–10.2)
Chloride: 104 mmol/L (ref 96–106)
Creatinine, Ser: 1.01 mg/dL (ref 0.76–1.27)
Globulin, Total: 2.2 g/dL (ref 1.5–4.5)
Glucose: 98 mg/dL (ref 70–99)
Potassium: 5 mmol/L (ref 3.5–5.2)
Sodium: 142 mmol/L (ref 134–144)
Total Protein: 6.9 g/dL (ref 6.0–8.5)
eGFR: 101 mL/min/{1.73_m2} (ref >59)

## 2023-03-29 LAB — LIPID PANEL
Chol/HDL Ratio: 3 ratio (ref 0.0–5.0)
Cholesterol, Total: 154 mg/dL (ref 100–199)
HDL: 52 mg/dL (ref >39)
LDL Chol Calc (NIH): 87 mg/dL (ref 0–99)
Triglycerides: 75 mg/dL (ref 0–149)
VLDL Cholesterol Cal: 15 mg/dL (ref 5–40)

## 2023-04-18 ENCOUNTER — Encounter: Payer: Self-pay | Admitting: Family Medicine

## 2023-04-18 ENCOUNTER — Telehealth: Payer: Managed Care, Other (non HMO) | Admitting: Family Medicine

## 2023-04-18 ENCOUNTER — Ambulatory Visit: Payer: Managed Care, Other (non HMO) | Admitting: Family Medicine

## 2023-04-18 DIAGNOSIS — F411 Generalized anxiety disorder: Secondary | ICD-10-CM

## 2023-04-18 DIAGNOSIS — Z716 Tobacco abuse counseling: Secondary | ICD-10-CM

## 2023-04-18 DIAGNOSIS — F330 Major depressive disorder, recurrent, mild: Secondary | ICD-10-CM | POA: Diagnosis not present

## 2023-04-18 NOTE — Progress Notes (Signed)
MyChart Video Visit    Virtual Visit via Video Note   This format is felt to be most appropriate for this patient at this time. Physical exam was limited by quality of the video and audio technology used for the visit.   Patient location: Work, here in [employer] Provider location: [employer]  I discussed the limitations of evaluation and management by telemedicine and the availability of in person appointments. The patient expressed understanding and agreed to proceed.  Patient: Warren Day   DOB: 05-18-1991   32 y.o. Male  MRN: [id number] Visit Date: 04/18/2023  Today's healthcare provider: Sherlyn Hay, DO   No chief complaint on file.  Subjective    HPI  The patient, with a history of depression and anxiety, reports feeling "down" and experiencing a lack of interest or pleasure in activities on several days over the past two weeks. He also reports feeling tired and having trouble concentrating on some days. However, he denies any recent thoughts of self-harm or suicide.   At the last visit, we started taking bupropion, which she began taking approximately 5 days after that visit.  He reports some initial difficulty in establishing a consistent schedule for taking the medication twice daily.  He is also attempting to quit smoking.   -  recent reduction in smoking: he is down to one to three cigarettes per day.   -  he notes that he finds it more challenging to resist the urge to smoke when at home, particularly as his spouse is also a smoker.  He was unfortunately unable to establish care with psychology due to the office not accepting his insurance.  - He has not yet checked his insurance's website to investigate potential providers covered by his insurance.  He recalls a previous positive experience with therapy during his teenage years and believes it could be beneficial again.   The patient's mood and energy levels appear to be somewhat variable, with  some days being better than others. He acknowledges feeling irritable and having a short temper on several days, but he also reports being able to keep his emotions in check. He denies any significant impact of his mood or anxiety on his ability to function at work or get along with others.  His wife's procedure did go well, and he endorses that the holidays have created a welcome distraction.   Medications: Outpatient Medications Prior to Visit  Medication Sig   buPROPion (WELLBUTRIN SR) 150 MG 12 hr tablet Take 1 tablet (150 mg total) by mouth 2 (two) times daily.   No facility-administered medications prior to visit.        Objective    There were no vitals taken for this visit.     Physical Exam Constitutional:      General: He is not in acute distress.    Appearance: Normal appearance. He is not diaphoretic.  HENT:     Head: Normocephalic.  Eyes:     Conjunctiva/sclera: Conjunctivae normal.  Pulmonary:     Effort: Pulmonary effort is normal. No respiratory distress.  Neurological:     Mental Status: He is alert and oriented to person, place, and time. Mental status is at baseline.       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 Taking bupropion as prescribed with some  improvement, particularly at work. Struggles with mood and motivation at home. No recent suicidal ideation. Considering therapy but has not yet found a provider covered by insurance. Discussed that bupropion can take up to eight weeks for maximal effect. Therapy can help reframe mindset and address issues. - Continue bupropion at current dose (150 mg twice daily) - Encourage exploration of therapy options covered by insurance   - Will send additional referral if needed; patient to contact clinic to let us know where to send it. - Follow up  in January to assess progress and discuss potential dose adjustment if needed   Generalized anxiety disorder Assessment & Plan: Reports symptoms of anxiety including nervousness, excessive worry, and increased irritability for several days over the past two weeks. Symptoms have not significantly impacted function at work or home. Discussed that therapy can help reframe mindset and address issues. - Continue current management with bupropion - Encourage exploration of therapy options covered by insurance - Follow up in January to assess progress   Encounter for smoking cessation counseling Assessment & Plan: Actively trying to quit smoking and had set a stop date, which he was able to stick with for several days. Otherwise, has reduced smoking significantly, with some days smoking only one cigarette. Finds it easier to avoid smoking at work but struggles more at home, especially since spouse also smokes. Discussed that mutual support with spouse could be beneficial. - Continue bupropion to aid in smoking cessation - Encourage discussion of quitting with spouse for mutual support - Follow up in January to assess progress    Return in about 6 weeks (around 05/30/2023) for Anx/Dep.     I discussed the assessment and treatment plan with the patient. The patient was provided an opportunity to ask questions and all were answered. The patient agreed with the plan and demonstrated an understanding of the instructions.   The patient was advised to call back or seek an in-person evaluation if the symptoms worsen or if the condition fails to improve as anticipated.  I provided 14 minutes of virtual-face-to-face time during this encounter.   Sherlyn Hay, DO [facility] [phone] (phone) [phone] (fax)  [facility]

## 2023-04-18 NOTE — Assessment & Plan Note (Signed)
Reports symptoms of depression including anhedonia, low mood, and fatigue for several days over the past two weeks. Taking bupropion as prescribed with some improvement, particularly at work. Struggles with mood and motivation at home. No recent suicidal ideation. Considering therapy but has not yet found a provider covered by insurance. Discussed that bupropion can take up to eight weeks for maximal effect. Therapy can help reframe mindset and address issues. - Continue bupropion at current dose (150 mg twice daily) - Encourage exploration of therapy options covered by insurance   - Will send additional referral if needed; patient to contact clinic to let us know where to send it. - Follow up in January to assess progress and discuss potential dose adjustment if needed

## 2023-04-18 NOTE — Assessment & Plan Note (Signed)
Actively trying to quit smoking and had set a stop date, which he was able to stick with for several days. Otherwise, has reduced smoking significantly, with some days smoking only one cigarette. Finds it easier to avoid smoking at work but struggles more at home, especially since spouse also smokes. Discussed that mutual support with spouse could be beneficial. - Continue bupropion to aid in smoking cessation - Encourage discussion of quitting with spouse for mutual support - Follow up in January to assess progress

## 2023-04-18 NOTE — Assessment & Plan Note (Addendum)
Reports symptoms of anxiety including nervousness, excessive worry, and increased irritability for several days over the past two weeks. Symptoms have not significantly impacted function at work or home. Discussed that therapy can help reframe mindset and address issues. - Continue current management with bupropion - Encourage exploration of therapy options covered by insurance - Follow up in January to assess progress

## 2023-06-13 ENCOUNTER — Telehealth: Payer: Managed Care, Other (non HMO) | Admitting: Physician Assistant

## 2023-06-13 DIAGNOSIS — J029 Acute pharyngitis, unspecified: Secondary | ICD-10-CM | POA: Diagnosis not present

## 2023-06-13 NOTE — Progress Notes (Signed)
E-Visit for Sore Throat  We are sorry that you are not feeling well.  Here is how we plan to help!  Your symptoms indicate a likely viral infection (Pharyngitis).   Pharyngitis is inflammation in the back of the throat which can cause a sore throat, scratchiness and sometimes difficulty swallowing.   Pharyngitis is typically caused by a respiratory virus and will just run its course.  Please keep in mind that your symptoms could last up to 10 days.  For throat pain, we recommend over the counter oral pain relief medications such as acetaminophen or aspirin, or anti-inflammatory medications such as ibuprofen or naproxen sodium.  Topical treatments such as oral throat lozenges or sprays may be used as needed.  Avoid close contact with loved ones, especially the very young and elderly.  Remember to wash your hands thoroughly throughout the day as this is the number one way to prevent the spread of infection and wipe down door knobs and counters with disinfectant.  After careful review of your answers, I would not recommend an antibiotic for your condition.  Antibiotics should not be used to treat conditions that we suspect are caused by viruses like the virus that causes the common cold or flu. However, some people can have Strep with atypical symptoms. You may need formal testing in clinic or office to confirm if your symptoms continue or worsen.  Providers prescribe antibiotics to treat infections caused by bacteria. Antibiotics are very powerful in treating bacterial infections when they are used properly.  To maintain their effectiveness, they should be used only when necessary.  Overuse of antibiotics has resulted in the development of super bugs that are resistant to treatment!    Home Care: Only take medications as instructed by your medical team. Do not drink alcohol while taking these medications. A steam or ultrasonic humidifier can help congestion.  You can place a towel over your head and  breathe in the steam from hot water coming from a faucet. Avoid close contacts especially the very young and the elderly. Cover your mouth when you cough or sneeze. Always remember to wash your hands.  Get Help Right Away If: You develop worsening fever or throat pain. You develop a severe head ache or visual changes. Your symptoms persist after you have completed your treatment plan.  Make sure you Understand these instructions. Will watch your condition. Will get help right away if you are not doing well or get worse.   Thank you for choosing an e-visit.  Your e-visit answers were reviewed by a board certified advanced clinical practitioner to complete your personal care plan. Depending upon the condition, your plan could have included both over the counter or prescription medications.  Please review your pharmacy choice. Make sure the pharmacy is open so you can pick up prescription now. If there is a problem, you may contact your provider through MyChart messaging and have the prescription routed to another pharmacy.  Your safety is important to us. If you have drug allergies check your prescription carefully.   For the next 24 hours you can use MyChart to ask questions about today's visit, request a non-urgent call back, or ask for a work or school excuse. You will get an email in the next two days asking about your experience. I hope that your e-visit has been valuable and will speed your recovery.  I have spent 5 minutes in review of e-visit questionnaire, review and updating patient chart, medical decision making and response   to patient.   Martinez Boxx S Mayers, PA-C     

## 2023-08-03 ENCOUNTER — Encounter: Payer: Self-pay | Admitting: Family Medicine

## 2023-09-05 ENCOUNTER — Telehealth: Payer: Self-pay

## 2023-09-05 DIAGNOSIS — Z141 Cystic fibrosis carrier: Secondary | ICD-10-CM

## 2023-09-05 NOTE — Telephone Encounter (Signed)
 Copied from CRM [id number]. Topic: Appointments - Appointment Scheduling >> Sep 05, 2023 11:40 AM Rosaria Common wrote: Patient/patient representative is calling to schedule an appointment. Refer to attachments for appointment information. Pt is calling to setup genetic testing due to his s/o being pregnant.

## 2023-09-05 NOTE — Telephone Encounter (Signed)
 Copied from CRM [id number]. Topic: Referral - Request for Referral >> Sep 05, 2023 11:44 AM Rosaria Common wrote: Did the patient discuss referral with their provider in the last year? Yes (If No - schedule appointment) (If Yes - send message)  Appointment offered? No  Type of order/referral and detailed reason for visit: Genetic Testing due to new baby.   Preference of office, provider, location: Any  If referral order, have you been seen by this specialty before? No (If Yes, this issue or another issue? When? Where?  Can we respond through MyChart? Yes Can be reached [phone]

## 2023-09-05 NOTE — Telephone Encounter (Signed)
 Dr. Athena Bland.  This pt called in wanting to know if you do genetic testing.  Do you want an appt scheduled for this or do you want to refer the pt.

## 2023-09-06 NOTE — Telephone Encounter (Signed)
 Called pt to get more information.  Pt states that his partner is pregnant and is a carrier of cystic fibrosis and he is wanting to find out if he is and what type if so.  He states that he doesn't know if there is a panel for all genetic testing and what type.  I spoke with Dr. Athena Bland about this.

## 2023-09-21 ENCOUNTER — Ambulatory Visit: Attending: Maternal & Fetal Medicine

## 2023-09-21 ENCOUNTER — Ambulatory Visit

## 2023-09-21 ENCOUNTER — Other Ambulatory Visit: Payer: Self-pay

## 2023-09-21 DIAGNOSIS — Z31440 Encounter of male for testing for genetic disease carrier status for procreative management: Secondary | ICD-10-CM

## 2023-09-21 NOTE — Progress Notes (Signed)
 St here with his partner, Warren Day for genetic counseling and carrier testing, as she is a known carrier for Cystic fibrosis. He elected the [employer] 14 screening panel.  We will contact him with results when they become available.  Purnell Bruch, MS, CGC

## 2023-09-29 ENCOUNTER — Telehealth: Payer: Self-pay

## 2023-09-29 NOTE — Telephone Encounter (Signed)
 I called the patient to discuss his recent [employer] carrier screening results. He is the partner of Truman Gables DOB 07/27/1990. He was not found to be a carrier for the 12 conditions screened for, including CF. This significantly reduces but does not eliminate the chance that he is a carrier for those conditions. Please see report for details.  Georgean Kindle, MS, [facility] Certified Genetic Counselor [facility] [phone]

## 2023-11-30 ENCOUNTER — Encounter: Payer: Self-pay | Admitting: Family Medicine
